# Patient Record
Sex: Male | Born: 1970 | Race: White | Hispanic: No | Marital: Married | Smoking: Former smoker
Health system: Southern US, Community
[De-identification: ages and names within clinical notes are randomized; demographics above are authoritative.]

## PROBLEM LIST (undated history)

## (undated) DIAGNOSIS — N200 Calculus of kidney: Secondary | ICD-10-CM

## (undated) HISTORY — PX: NECK SURGERY: SHX720

## (undated) HISTORY — PX: KNEE SURGERY: SHX244

## (undated) HISTORY — PX: KIDNEY STONE SURGERY: SHX686

## (undated) HISTORY — PX: MOUTH SURGERY: SHX715

## (undated) HISTORY — PX: VASECTOMY: SHX75

---

## 2016-02-24 ENCOUNTER — Emergency Department (HOSPITAL_BASED_OUTPATIENT_CLINIC_OR_DEPARTMENT_OTHER): Payer: 59

## 2016-02-24 ENCOUNTER — Encounter (HOSPITAL_BASED_OUTPATIENT_CLINIC_OR_DEPARTMENT_OTHER): Payer: Self-pay

## 2016-02-24 ENCOUNTER — Emergency Department (HOSPITAL_BASED_OUTPATIENT_CLINIC_OR_DEPARTMENT_OTHER)
Admission: EM | Admit: 2016-02-24 | Discharge: 2016-02-24 | Disposition: A | Discharge status: Home / Self Care | Payer: 59 | Attending: Emergency Medicine | Admitting: Emergency Medicine

## 2016-02-24 DIAGNOSIS — Y939 Activity, unspecified: Secondary | ICD-10-CM | POA: Diagnosis not present

## 2016-02-24 DIAGNOSIS — Y929 Unspecified place or not applicable: Secondary | ICD-10-CM | POA: Insufficient documentation

## 2016-02-24 DIAGNOSIS — S5001XA Contusion of right elbow, initial encounter: Secondary | ICD-10-CM | POA: Insufficient documentation

## 2016-02-24 DIAGNOSIS — Z87891 Personal history of nicotine dependence: Secondary | ICD-10-CM | POA: Diagnosis not present

## 2016-02-24 DIAGNOSIS — Y999 Unspecified external cause status: Secondary | ICD-10-CM | POA: Diagnosis not present

## 2016-02-24 DIAGNOSIS — W01198A Fall on same level from slipping, tripping and stumbling with subsequent striking against other object, initial encounter: Secondary | ICD-10-CM | POA: Insufficient documentation

## 2016-02-24 DIAGNOSIS — M25521 Pain in right elbow: Secondary | ICD-10-CM | POA: Diagnosis present

## 2016-02-24 HISTORY — DX: Calculus of kidney: N20.0

## 2016-02-24 MED ORDER — NAPROXEN 250 MG PO TABS
500.0000 mg | ORAL_TABLET | Freq: Once | ORAL | Status: AC
  Administered 2016-02-24: 500 mg via ORAL
  Filled 2016-02-24: qty 2

## 2016-02-24 MED ORDER — NAPROXEN 500 MG PO TABS: 500.0000 mg | ORAL_TABLET | Freq: Two times a day (BID) | ORAL | Status: AC

## 2016-02-24 NOTE — ED Notes (Signed)
MD at bedside. 

## 2016-02-24 NOTE — ED Provider Notes (Signed)
CSN: [id number]     Arrival date & time 02/24/16  1848 History  By signing my name below, I, Doreatha Martin, attest that this documentation has been prepared under the direction and in the presence of Linwood Dibbles, MD. Electronically Signed: Doreatha Martin, ED Scribe. 02/24/2016. 7:28 PM.    Chief Complaint  Patient presents with  . Fall   The history is provided by the patient. No language interpreter was used.   HPI Comments: Roberto Lloyd is a 45 y.o. male who presents to the Emergency Department complaining of moderate right elbow pain s/p injury that occurred this afternoon. Pt states that he tripped and fell to the left, striking his right elbow on a brick wall. He denies LOC, head injury. Per pt, his pain is worsened with movement and most significantly with extension. Pt states he has taken Ibuprofen with no relief of pain. He denies weakness, paresthesia, wrist pain, additional injuries.   Past Medical History  Diagnosis Date  . Kidney stone    Past Surgical History  Procedure Laterality Date  . Knee surgery    . Neck surgery    . Kidney stone surgery    . Vasectomy    . Mouth surgery     No family history on file. Social History  Substance Use Topics  . Smoking status: Former Games developer  . Smokeless tobacco: None  . Alcohol Use: Yes     Comment: occ    Review of Systems  Musculoskeletal: Positive for arthralgias.  Neurological: Negative for weakness and numbness.       -paresthesia  All other systems reviewed and are negative.  Allergies  Review of patient's allergies indicates no known allergies.  Home Medications   Prior to Admission medications   Medication Sig Start Date End Date Taking? Authorizing Provider  naproxen (NAPROSYN) 500 MG tablet Take 1 tablet (500 mg total) by mouth 2 (two) times daily. 02/24/16   Linwood Dibbles, MD   BP 131/95 mmHg  Pulse 92  Temp(Src) 98.1 F (36.7 C) (Oral)  Resp 18  Ht  (1.905 m)  Wt 149.687 kg  BMI 41.25 kg/m2  SpO2  98% Physical Exam  Constitutional: He appears well-developed and well-nourished. No distress.  HENT:  Head: Normocephalic and atraumatic.  Right Ear: External ear normal.  Left Ear: External ear normal.  Eyes: Conjunctivae are normal. Right eye exhibits no discharge. Left eye exhibits no discharge. No scleral icterus.  Neck: Neck supple. No tracheal deviation present.  Cardiovascular: Normal rate.   Pulmonary/Chest: Effort normal. No stridor. No respiratory distress.  Musculoskeletal: He exhibits no edema.       Right elbow: He exhibits decreased range of motion ( pain with supination and pronation ). He exhibits no swelling, no deformity and no laceration. Tenderness found.       Right wrist: Normal.       Right forearm: He exhibits tenderness. He exhibits no swelling, no edema and no deformity.  Neurological: He is alert. Cranial nerve deficit: no gross deficits.  Skin: Skin is warm and dry. No rash noted.  Psychiatric: He has a normal mood and affect.  Nursing note and vitals reviewed.   ED Course  Procedures (including critical care time) DIAGNOSTIC STUDIES: Oxygen Saturation is 98% on RA, normal by my interpretation.    COORDINATION OF CARE: 7:20 PM Discussed treatment plan with pt at bedside which includes XR and pt agreed to plan.   Imaging Review Dg Elbow Complete Right  02/24/2016  CLINICAL DATA:  Fall.  Arm pain EXAM: RIGHT ELBOW - COMPLETE 3+ VIEW COMPARISON:  None. FINDINGS: There is no evidence of fracture, dislocation, or joint effusion. There is no evidence of arthropathy or other focal bone abnormality. Soft tissues are unremarkable. IMPRESSION: Negative. Electronically Signed   By: Marlan Palauharles  Clark M.D.   On: 02/24/2016 20:01   Dg Forearm Right  02/24/2016  CLINICAL DATA:  Fall.  Arm pain EXAM: RIGHT FOREARM - 2 VIEW COMPARISON:  None. FINDINGS: There is no evidence of fracture or other focal bone lesions. Soft tissues are unremarkable. IMPRESSION: Negative.  Electronically Signed   By: Marlan Palauharles  Clark M.D.   On: 02/24/2016 20:01   I have personally reviewed and evaluated these images as part of my medical decision-making.   MDM   Final diagnoses:  Elbow contusion, right, initial encounter  No fx or effusion on xray.   Doubt occult fx.  Will dc home with nsaids.  Follow up with in 1 week if sx have not resolved.  I personally performed the services described in this documentation, which was scribed in my presence.  The recorded information has been reviewed and is accurate.    Linwood DibblesJon Amour Cutrone, MD 02/24/16 2049

## 2016-02-24 NOTE — Discharge Instructions (Signed)

## 2016-02-24 NOTE — ED Notes (Signed)
Fell against brick wall this am-pain to right UE-NAD-steady gait

## 2017-01-14 IMAGING — CR DG FOREARM 2V*R*
2 series · 2 of 2 positions shown · non-contrast
Comparison: None.

CLINICAL DATA: Fall.  Arm pain

EXAM:
RIGHT FOREARM - 2 VIEW

[x forearm ap right]
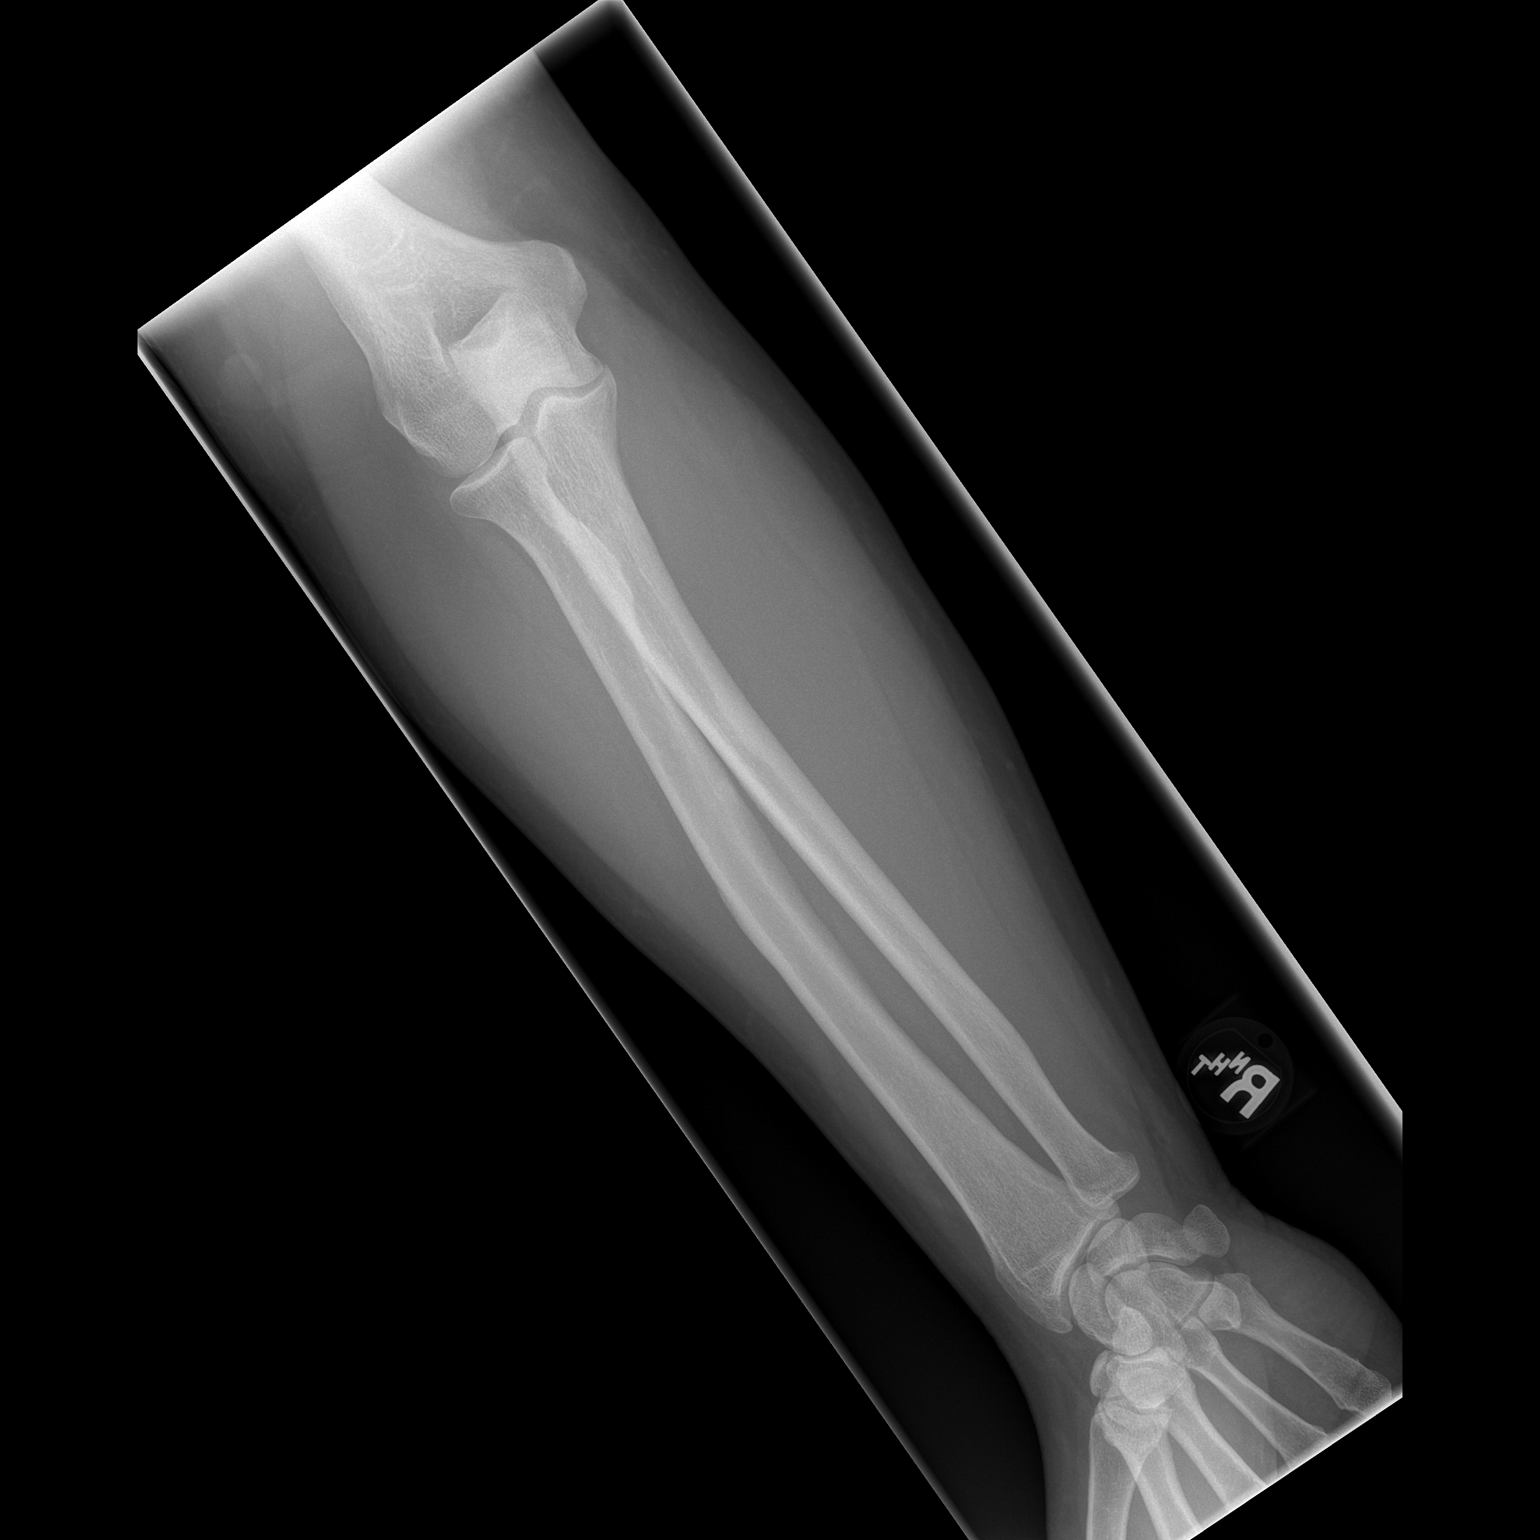

[x forearm lat right]
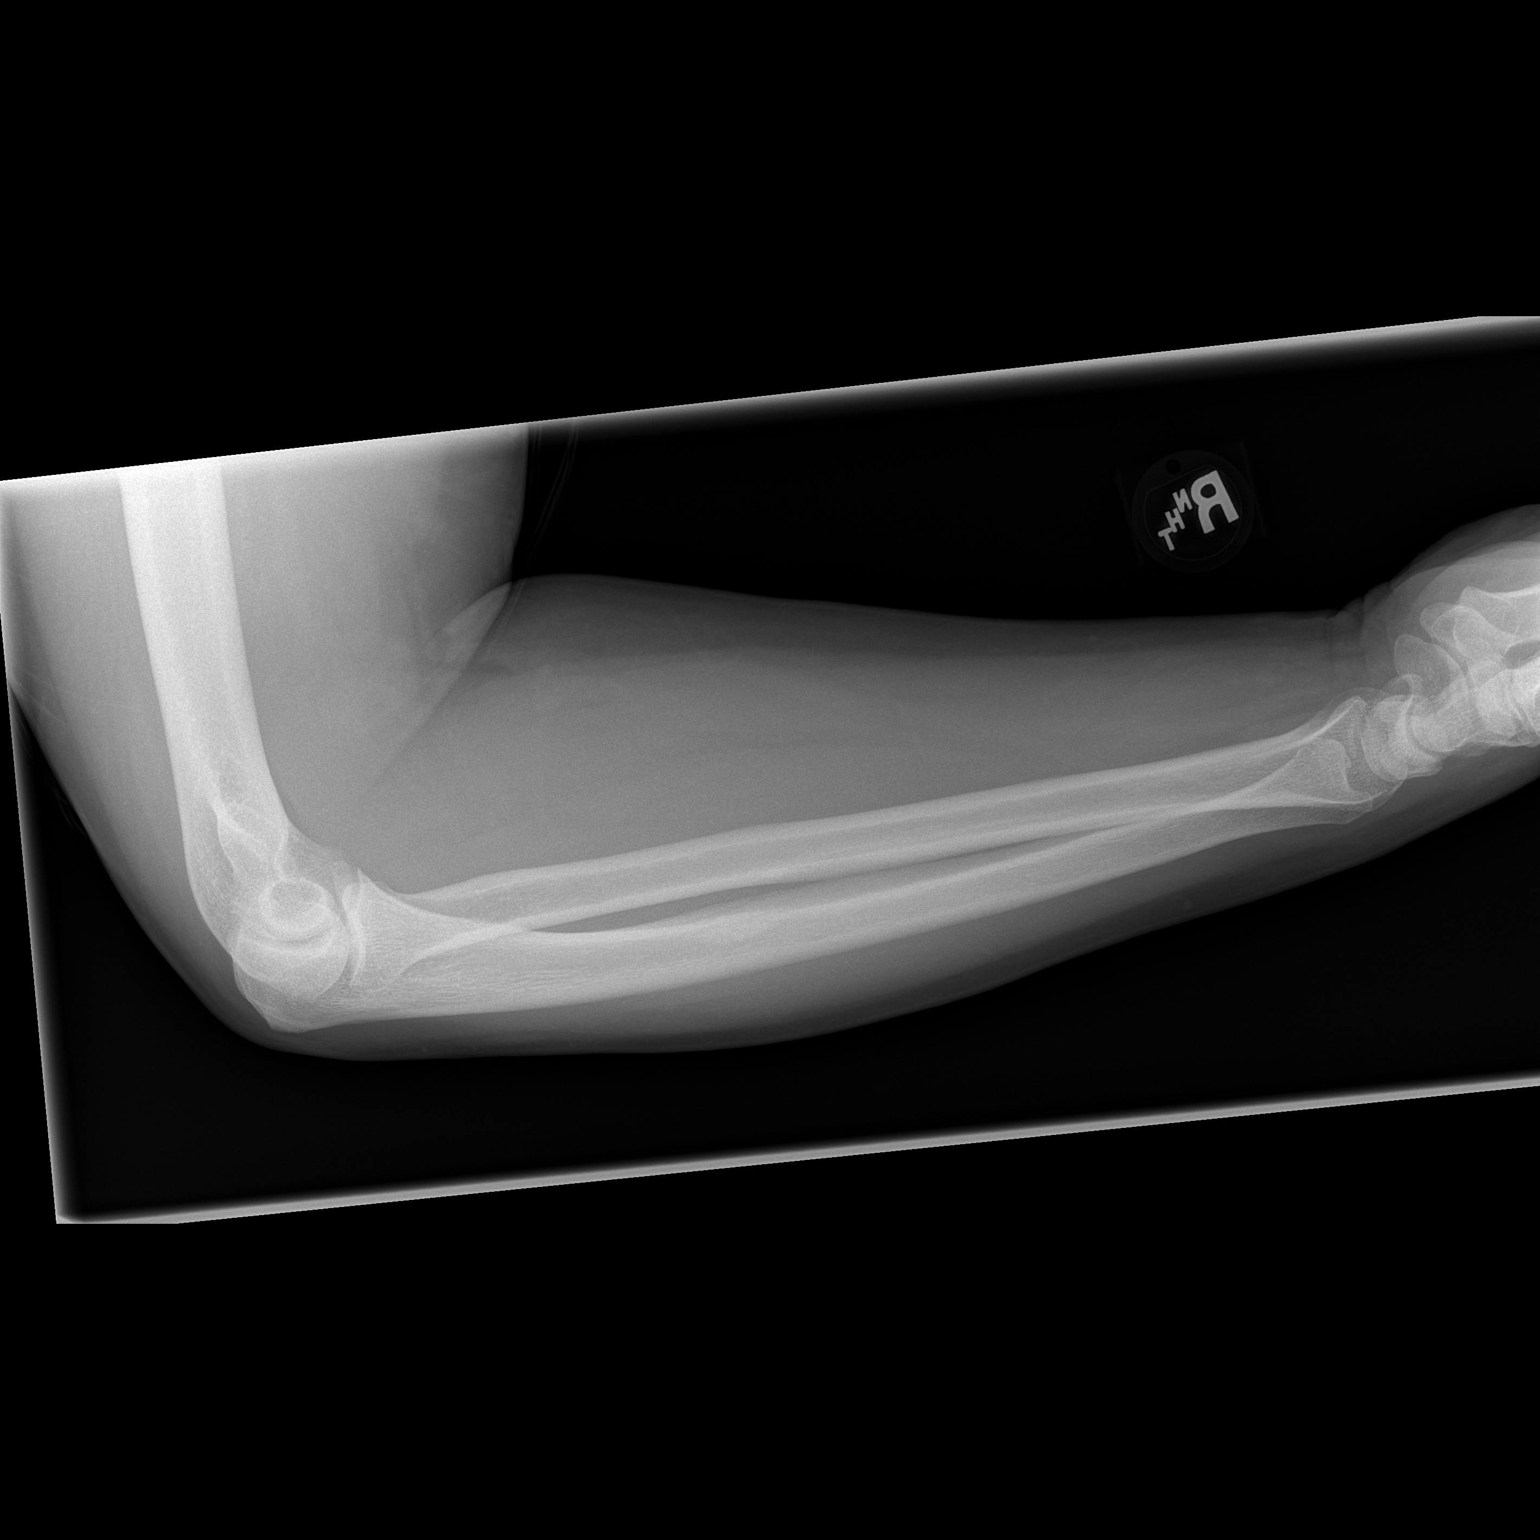

[2 of 2 positions shown; findings below may reference images not displayed]

FINDINGS: There is no evidence of fracture or other focal bone lesions. Soft
tissues are unremarkable.
IMPRESSION: Negative.
